# Patient Record
Sex: Female | Born: 1990 | Race: White | Hispanic: No | Smoking: Current every day smoker
Health system: Southern US, Community
[De-identification: ages and names within clinical notes are randomized; demographics above are authoritative.]

## PROBLEM LIST (undated history)

## (undated) DIAGNOSIS — K2100 Gastro-esophageal reflux disease with esophagitis, without bleeding: Secondary | ICD-10-CM

## (undated) DIAGNOSIS — J45909 Unspecified asthma, uncomplicated: Secondary | ICD-10-CM

---

## 2017-02-09 ENCOUNTER — Encounter (HOSPITAL_BASED_OUTPATIENT_CLINIC_OR_DEPARTMENT_OTHER): Payer: Self-pay | Admitting: *Deleted

## 2017-02-09 ENCOUNTER — Emergency Department (HOSPITAL_BASED_OUTPATIENT_CLINIC_OR_DEPARTMENT_OTHER)
Admission: EM | Admit: 2017-02-09 | Discharge: 2017-02-09 | Disposition: A | Discharge status: Home / Self Care | Payer: Self-pay | Attending: Emergency Medicine | Admitting: Emergency Medicine

## 2017-02-09 ENCOUNTER — Emergency Department (HOSPITAL_BASED_OUTPATIENT_CLINIC_OR_DEPARTMENT_OTHER): Payer: Self-pay

## 2017-02-09 DIAGNOSIS — J45901 Unspecified asthma with (acute) exacerbation: Secondary | ICD-10-CM

## 2017-02-09 DIAGNOSIS — Z79899 Other long term (current) drug therapy: Secondary | ICD-10-CM | POA: Insufficient documentation

## 2017-02-09 DIAGNOSIS — R0602 Shortness of breath: Secondary | ICD-10-CM | POA: Insufficient documentation

## 2017-02-09 DIAGNOSIS — R05 Cough: Secondary | ICD-10-CM | POA: Insufficient documentation

## 2017-02-09 DIAGNOSIS — J45909 Unspecified asthma, uncomplicated: Secondary | ICD-10-CM | POA: Insufficient documentation

## 2017-02-09 HISTORY — DX: Gastro-esophageal reflux disease with esophagitis: K21.0

## 2017-02-09 HISTORY — DX: Unspecified asthma, uncomplicated: J45.909

## 2017-02-09 HISTORY — DX: Gastro-esophageal reflux disease with esophagitis, without bleeding: K21.00

## 2017-02-09 MED ORDER — PREDNISONE 10 MG PO TABS
20.0000 mg | ORAL_TABLET | Freq: Two times a day (BID) | ORAL | 0 refills | Status: DC
Start: 2017-02-09 — End: 2023-04-17

## 2017-02-09 MED ORDER — IPRATROPIUM-ALBUTEROL 0.5-2.5 (3) MG/3ML IN SOLN
RESPIRATORY_TRACT | Status: AC
  Administered 2017-02-09: 3 mL
  Filled 2017-02-09: qty 3

## 2017-02-09 NOTE — ED Provider Notes (Signed)
[facility] Provider Note   CSN: [id number] Arrival date & time: 02/09/17  1019     History   Chief Complaint Chief Complaint  Patient presents with  . Cough    HPI Lindsay Wall is a 26 y.o. female.  Patient is a 26 year old female with history of asthma. She presents with shortness of breath and wheezing over the past 3 days. She does report some cough which is minimally productive. She denies any chest pain or fevers. She has tried her prescribed inhalers with minimal relief.   The history is provided by the patient.  Shortness of Breath  This is a recurrent problem. The average episode lasts 3 days. The problem occurs continuously.The problem has been gradually worsening. Associated symptoms include cough. Pertinent negatives include no fever, no sputum production and no chest pain. She has tried beta-agonist inhalers for the symptoms. The treatment provided no relief. Associated medical issues include asthma.    Past Medical History:  Diagnosis Date  . Asthma   . Reflux esophagitis     There are no active problems to display for this patient.   Past Surgical History:  Procedure Laterality Date  . CESAREAN SECTION      OB History    No data available       Home Medications    Prior to Admission medications   Medication Sig Start Date End Date Taking? Authorizing Provider  albuterol (PROVENTIL) (2.5 MG/3ML) 0.083% nebulizer solution Take 2.5 mg by nebulization every 6 (six) hours as needed for wheezing or shortness of breath.   Yes [provider]  fluticasone-salmeterol (ADVAIR HFA) 115-21 MCG/ACT inhaler Inhale 2 puffs into the lungs 2 (two) times daily.   Yes [provider]  levonorgestrel (MIRENA) 20 MCG/24HR IUD 1 each by Intrauterine route once.   Yes [provider]  omeprazole (PRILOSEC) 10 MG capsule Take 10 mg by mouth daily.   Yes [provider]    Family History No family history on  file.  Social History Social History  Substance Use Topics  . Smoking status: Never Smoker  . Smokeless tobacco: Never Used  . Alcohol use No     Allergies   Zithromax [azithromycin]   Review of Systems Review of Systems  Constitutional: Negative for fever.  Respiratory: Positive for cough and shortness of breath. Negative for sputum production.   Cardiovascular: Negative for chest pain.  All other systems reviewed and are negative.    Physical Exam Updated Vital Signs BP 130/84   Pulse 68   Temp 99.3 F (37.4 C) (Oral)   Resp 14   Ht 5\' 5"  (1.651 m)   Wt 107 kg (236 lb)   SpO2 99%   BMI 39.27 kg/m   Physical Exam  Constitutional: She is oriented to person, place, and time. She appears well-developed and well-nourished. No distress.  HENT:  Head: Normocephalic and atraumatic.  Mouth/Throat: Oropharynx is clear and moist.  Neck: Normal range of motion. Neck supple.  Cardiovascular: Normal rate and regular rhythm.  Exam reveals no gallop and no friction rub.   No murmur heard. Pulmonary/Chest: Effort normal and breath sounds normal. No respiratory distress. She has no wheezes.  Abdominal: Soft. Bowel sounds are normal. She exhibits no distension. There is no tenderness.  Musculoskeletal: Normal range of motion.  Neurological: She is alert and oriented to person, place, and time.  Skin: Skin is warm and dry. She is not diaphoretic.  Nursing note and vitals reviewed.  ED Treatments / Results  Labs (all labs ordered are listed, but only abnormal results are displayed) Labs Reviewed - No data to display  EKG  EKG Interpretation None       Radiology Dg Chest 2 View  Result Date: 02/09/2017 CLINICAL DATA:  Cough and shortness of breath EXAM: CHEST  2 VIEW COMPARISON:  None. FINDINGS: The heart size and mediastinal contours are within normal limits. Both lungs are clear. The visualized skeletal structures are unremarkable. IMPRESSION: Negative chest.  Electronically Signed   By: Marnee SpringJonathon  Watts M.D.   On: 02/09/2017 12:26    Procedures Procedures (including critical care time)  Medications Ordered in ED Medications  ipratropium-albuterol (DUONEB) 0.5-2.5 (3) MG/3ML nebulizer solution (3 mLs  Given 02/09/17 1035)     Initial Impression / Assessment and Plan / ED Course  I have reviewed the triage vital signs and the nursing notes.  Pertinent labs & imaging results that were available during my care of the patient were reviewed by me and considered in my medical decision making (see chart for details).  Patient given an albuterol treatment and is feeling better. Her chest x-ray is clear. I suspect a viral URI with exacerbation of RAD. She will be discharged with a prednisone course, continued use of her inhaler, and follow-up as needed.  Final Clinical Impressions(s) / ED Diagnoses   Final diagnoses:  None    New Prescriptions New Prescriptions   No medications on file     Geoffery Lyonselo, Lyndel Sarate, MD 02/09/17 1235

## 2017-02-09 NOTE — Discharge Instructions (Signed)
Prednisone as prescribed.  Continue your inhalers as previously prescribed.  Return to the emergency department of symptoms significant only worsen or change.

## 2017-02-09 NOTE — ED Triage Notes (Signed)
Patient states she has a three day history of her asthma acting up.  States she has a productive cough with green brown secretions with occasional blood.

## 2019-04-08 IMAGING — CR DG CHEST 2V
2 series · 2 of 2 positions shown · non-contrast
Comparison: None.

CLINICAL DATA: Cough and shortness of breath

EXAM:
CHEST  2 VIEW

[w chest pa]
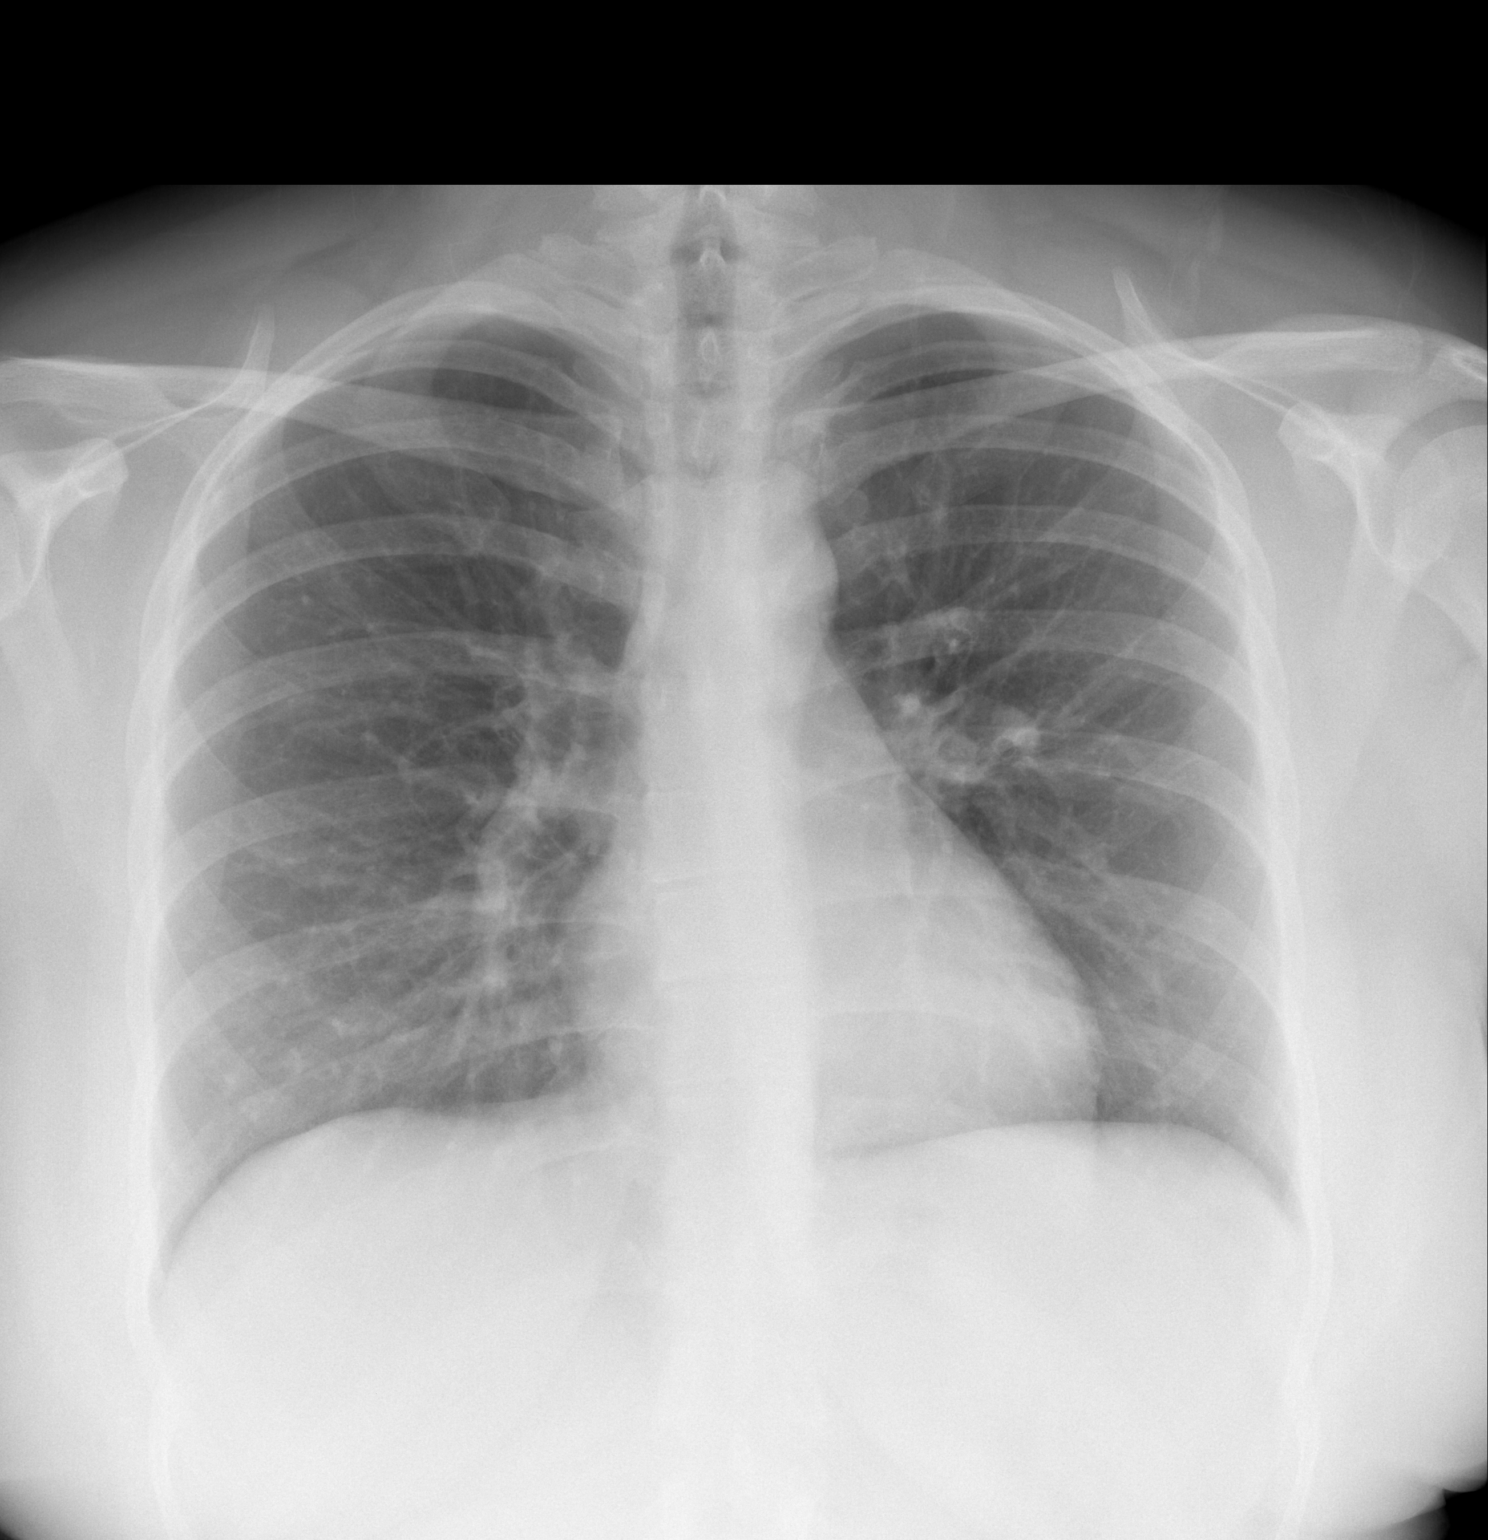

[w chest lat]
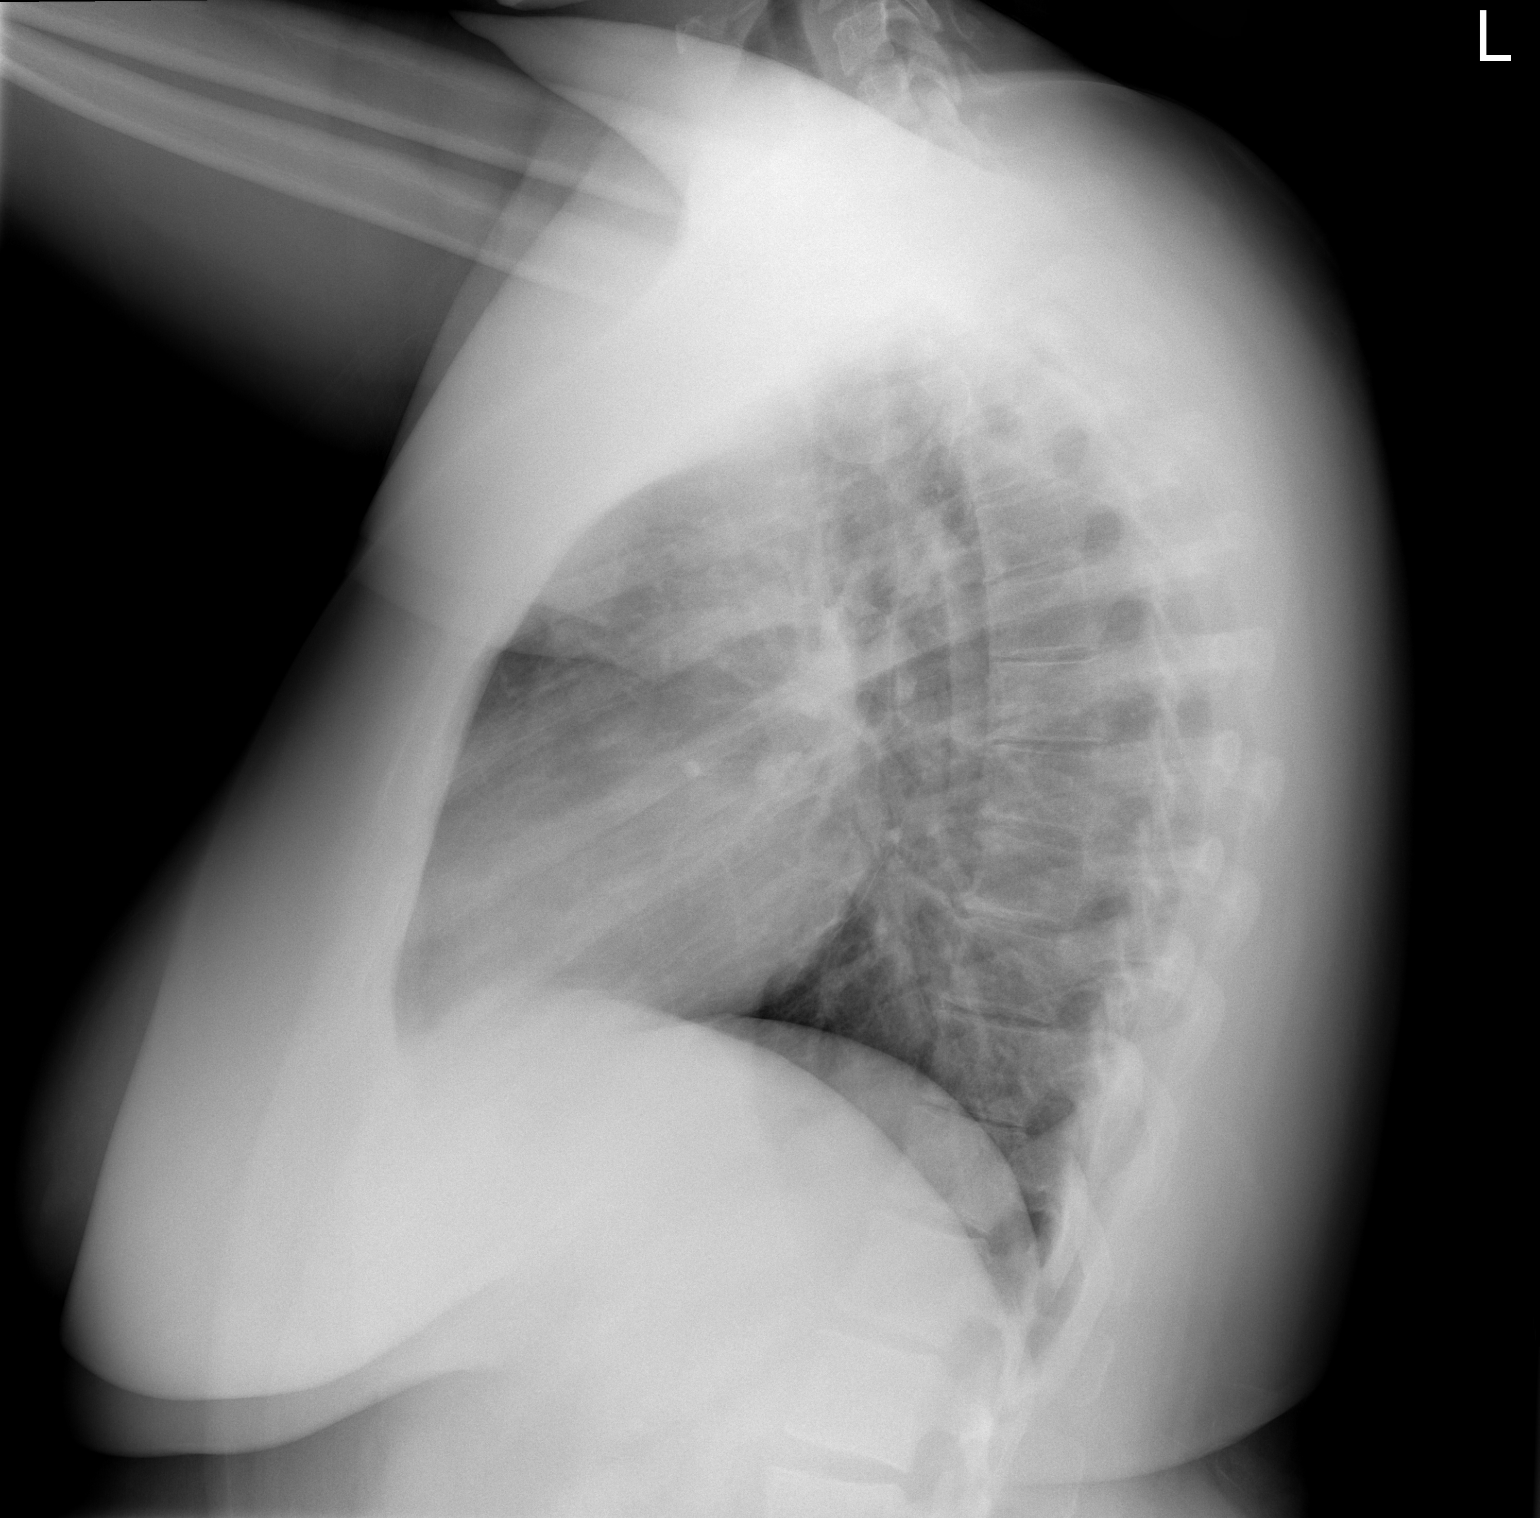

[2 of 2 positions shown; findings below may reference images not displayed]

FINDINGS: The heart size and mediastinal contours are within normal limits.
Both lungs are clear. The visualized skeletal structures are
unremarkable.
IMPRESSION: Negative chest.

## 2021-08-13 DIAGNOSIS — K219 Gastro-esophageal reflux disease without esophagitis: Secondary | ICD-10-CM | POA: Diagnosis not present

## 2021-08-13 DIAGNOSIS — F4323 Adjustment disorder with mixed anxiety and depressed mood: Secondary | ICD-10-CM | POA: Diagnosis not present

## 2021-08-13 DIAGNOSIS — K589 Irritable bowel syndrome without diarrhea: Secondary | ICD-10-CM | POA: Diagnosis not present

## 2021-08-13 DIAGNOSIS — F321 Major depressive disorder, single episode, moderate: Secondary | ICD-10-CM | POA: Diagnosis not present

## 2021-08-13 DIAGNOSIS — R110 Nausea: Secondary | ICD-10-CM | POA: Diagnosis not present

## 2021-10-10 DIAGNOSIS — K219 Gastro-esophageal reflux disease without esophagitis: Secondary | ICD-10-CM | POA: Diagnosis not present

## 2021-10-10 DIAGNOSIS — F321 Major depressive disorder, single episode, moderate: Secondary | ICD-10-CM | POA: Diagnosis not present

## 2021-11-26 DIAGNOSIS — Z79899 Other long term (current) drug therapy: Secondary | ICD-10-CM | POA: Diagnosis not present

## 2021-11-26 DIAGNOSIS — F321 Major depressive disorder, single episode, moderate: Secondary | ICD-10-CM | POA: Diagnosis not present

## 2021-11-26 DIAGNOSIS — K219 Gastro-esophageal reflux disease without esophagitis: Secondary | ICD-10-CM | POA: Diagnosis not present

## 2021-11-26 DIAGNOSIS — K589 Irritable bowel syndrome without diarrhea: Secondary | ICD-10-CM | POA: Diagnosis not present

## 2021-11-26 DIAGNOSIS — R110 Nausea: Secondary | ICD-10-CM | POA: Diagnosis not present

## 2021-12-31 DIAGNOSIS — R1010 Upper abdominal pain, unspecified: Secondary | ICD-10-CM | POA: Diagnosis not present

## 2021-12-31 DIAGNOSIS — R112 Nausea with vomiting, unspecified: Secondary | ICD-10-CM | POA: Diagnosis not present

## 2022-01-04 DIAGNOSIS — R1010 Upper abdominal pain, unspecified: Secondary | ICD-10-CM | POA: Diagnosis not present

## 2022-01-23 DIAGNOSIS — R109 Unspecified abdominal pain: Secondary | ICD-10-CM | POA: Diagnosis not present

## 2022-01-23 DIAGNOSIS — R1110 Vomiting, unspecified: Secondary | ICD-10-CM | POA: Diagnosis not present

## 2022-01-23 DIAGNOSIS — G8929 Other chronic pain: Secondary | ICD-10-CM | POA: Diagnosis not present

## 2022-01-23 DIAGNOSIS — K769 Liver disease, unspecified: Secondary | ICD-10-CM | POA: Diagnosis not present

## 2022-08-23 DIAGNOSIS — F330 Major depressive disorder, recurrent, mild: Secondary | ICD-10-CM | POA: Diagnosis not present

## 2022-08-23 DIAGNOSIS — F419 Anxiety disorder, unspecified: Secondary | ICD-10-CM | POA: Diagnosis not present

## 2023-04-17 ENCOUNTER — Ambulatory Visit: Payer: 59 | Admitting: Family Medicine

## 2023-04-17 ENCOUNTER — Encounter: Payer: Self-pay | Admitting: Family Medicine

## 2023-04-17 VITALS — BP 117/78 | HR 83 | Ht 66.0 in | Wt 241.8 lb

## 2023-04-17 DIAGNOSIS — R233 Spontaneous ecchymoses: Secondary | ICD-10-CM | POA: Diagnosis not present

## 2023-04-17 DIAGNOSIS — F321 Major depressive disorder, single episode, moderate: Secondary | ICD-10-CM | POA: Insufficient documentation

## 2023-04-17 DIAGNOSIS — F411 Generalized anxiety disorder: Secondary | ICD-10-CM | POA: Insufficient documentation

## 2023-04-17 DIAGNOSIS — R1111 Vomiting without nausea: Secondary | ICD-10-CM | POA: Insufficient documentation

## 2023-04-17 DIAGNOSIS — J4540 Moderate persistent asthma, uncomplicated: Secondary | ICD-10-CM | POA: Insufficient documentation

## 2023-04-17 MED ORDER — BUPROPION HCL ER (XL) 300 MG PO TB24
300.0000 mg | ORAL_TABLET | Freq: Every day | ORAL | 1 refills | Status: AC
Start: 2023-04-17 — End: ?

## 2023-04-17 MED ORDER — ALBUTEROL SULFATE HFA 108 (90 BASE) MCG/ACT IN AERS: 2.0000 | INHALATION_SPRAY | Freq: Four times a day (QID) | RESPIRATORY_TRACT | 0 refills | Status: AC | PRN

## 2023-04-17 MED ORDER — ALBUTEROL SULFATE (2.5 MG/3ML) 0.083% IN NEBU: 2.5000 mg | INHALATION_SOLUTION | Freq: Four times a day (QID) | RESPIRATORY_TRACT | 3 refills | Status: AC | PRN

## 2023-04-17 MED ORDER — TRELEGY ELLIPTA 100-62.5-25 MCG/ACT IN AEPB
1.0000 | INHALATION_SPRAY | Freq: Every day | RESPIRATORY_TRACT | 5 refills | Status: AC
Start: 2023-04-17 — End: ?

## 2023-04-17 MED ORDER — FLUOXETINE HCL 20 MG PO TABS: 20.0000 mg | ORAL_TABLET | Freq: Every day | ORAL | 1 refills | Status: AC

## 2023-04-17 NOTE — Assessment & Plan Note (Signed)
-   referral sent to GI pt said that she has seen GI in the past and no one has been able to figure this out. She was taking omeprazole when this happened so it is less likely this is reflux related. Did discuss if she uses marijuana and then tries to stop this can cause cyclic vomiting syndrome and pt seemed to think this could possible be it.

## 2023-04-17 NOTE — Assessment & Plan Note (Signed)
Refilled prozac 20mg  recommend pt follow up in 3 months for efficacy

## 2023-04-17 NOTE — Progress Notes (Signed)
New patient visit   Patient: Lindsay Wall   DOB: 1990/12/26   32 y.o. Female  MRN: [id number] Visit Date: 04/17/2023  Today's healthcare provider: Charlton Amor, DO   Chief Complaint  Patient presents with   New Patient (Initial Visit)    Establish care    SUBJECTIVE    Chief Complaint  Patient presents with   New Patient (Initial Visit)    Establish care   HPI HPI     New Patient (Initial Visit)    Additional comments: Establish care      Last edited by Roselyn Reef, CMA on 04/17/2023  3:48 PM.       Pt presents to establish care.   Has concerns of daily vomiting upon arising.   MDD/GAD - on prozac and buproprion has been working well for her  - interested in therapy   Asthma  - on albuterol needs inhaler daily   Has mirena, needs replacement   Has random bruising   Review of Systems  Constitutional:  Negative for activity change, fatigue and fever.  Respiratory:  Negative for cough and shortness of breath.   Cardiovascular:  Negative for chest pain.  Gastrointestinal:  Positive for vomiting. Negative for abdominal pain.  Genitourinary:  Negative for difficulty urinating.       Current Meds  Medication Sig   albuterol (VENTOLIN HFA) 108 (90 Base) MCG/ACT inhaler Inhale 2 puffs into the lungs every 6 (six) hours as needed for wheezing or shortness of breath.   buPROPion (WELLBUTRIN XL) 300 MG 24 hr tablet Take 1 tablet (300 mg total) by mouth daily.   FLUoxetine (PROZAC) 20 MG tablet Take 1 tablet (20 mg total) by mouth daily.   Fluticasone-Umeclidin-Vilant (TRELEGY ELLIPTA) 100-62.5-25 MCG/ACT AEPB Inhale 1 puff into the lungs daily.   levonorgestrel (MIRENA) 20 MCG/24HR IUD 1 each by Intrauterine route once.   omeprazole (PRILOSEC) 10 MG capsule Take 10 mg by mouth daily.   [DISCONTINUED] albuterol (PROVENTIL) (2.5 MG/3ML) 0.083% nebulizer solution Take 2.5 mg by nebulization every 6 (six) hours as needed for wheezing or shortness of  breath.   [DISCONTINUED] fluticasone-salmeterol (ADVAIR HFA) 115-21 MCG/ACT inhaler Inhale 2 puffs into the lungs 2 (two) times daily.   [DISCONTINUED] predniSONE (DELTASONE) 10 MG tablet Take 2 tablets (20 mg total) by mouth 2 (two) times daily.    OBJECTIVE    BP 117/78 (BP Location: Left Arm, Patient Position: Sitting, Cuff Size: Large)   Pulse 83   Ht 5\' 6"  (1.676 m)   Wt 241 lb 12 oz (109.7 kg)   SpO2 99%   BMI 39.02 kg/m   Physical Exam Vitals and nursing note reviewed.  Constitutional:      General: She is not in acute distress.    Appearance: Normal appearance.  HENT:     Head: Normocephalic and atraumatic.     Right Ear: External ear normal.     Left Ear: External ear normal.     Nose: Nose normal.  Eyes:     Conjunctiva/sclera: Conjunctivae normal.  Cardiovascular:     Rate and Rhythm: Normal rate and regular rhythm.  Pulmonary:     Effort: Pulmonary effort is normal.     Breath sounds: Normal breath sounds.  Neurological:     General: No focal deficit present.     Mental Status: She is alert and oriented to person, place, and time.  Psychiatric:        Mood and Affect: Mood normal.  Behavior: Behavior normal.        Thought Content: Thought content normal.        Judgment: Judgment normal.        ASSESSMENT & PLAN    Problem List Items Addressed This Visit       Respiratory   Moderate persistent asthma    Pt poorly controlled as she is having to use her albuterol inhaler daily  - increased therapy to trellegy  - refilled nebulizer treatment and inhaler      Relevant Medications   albuterol (PROVENTIL) (2.5 MG/3ML) 0.083% nebulizer solution   albuterol (VENTOLIN HFA) 108 (90 Base) MCG/ACT inhaler   Fluticasone-Umeclidin-Vilant (TRELEGY ELLIPTA) 100-62.5-25 MCG/ACT AEPB     Digestive   Vomiting without nausea    - referral sent to GI pt said that she has seen GI in the past and no one has been able to figure this out. She was taking  omeprazole when this happened so it is less likely this is reflux related. Did discuss if she uses marijuana and then tries to stop this can cause cyclic vomiting syndrome and pt seemed to think this could possible be it.       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 episode (HCC) - Primary    Refill bupropion       Relevant Medications   FLUoxetine (PROZAC) 20 MG tablet   buPROPion (WELLBUTRIN XL) 300 MG 24 hr tablet   Other Relevant Orders   Ambulatory referral to Behavioral Health   GAD (generalized anxiety disorder)    Refilled prozac 20mg  recommend pt follow up in 3 months for efficacy      Relevant Medications   FLUoxetine (PROZAC) 20 MG tablet   buPROPion (WELLBUTRIN XL) 300 MG 24 hr tablet   Other Relevant Orders   Ambulatory referral to Behavioral Health   Easy bruising    Pt notes easy brusing will go ahead and check platelet count and anemia       Relevant Orders   CBC w/Diff/Platelet   Fe+TIBC+Fer    Return in about 3 months (around 07/18/2023) for MDD and GAD follow up.      Meds ordered this encounter  Medications   albuterol (PROVENTIL) (2.5 MG/3ML) 0.083% nebulizer solution    Sig: Take 3 mLs (2.5 mg total) by nebulization every 6 (six) hours as needed for wheezing or shortness of breath.    Dispense:  75 mL    Refill:  3   albuterol (VENTOLIN HFA) 108 (90 Base) MCG/ACT inhaler    Sig: Inhale 2 puffs into the lungs every 6 (six) hours as needed for wheezing or shortness of breath.    Dispense:  8 g    Refill:  0   FLUoxetine (PROZAC) 20 MG tablet    Sig: Take 1 tablet (20 mg total) by mouth daily.    Dispense:  90 tablet    Refill:  1   buPROPion (WELLBUTRIN XL) 300 MG 24 hr tablet    Sig: Take 1 tablet (300 mg total) by mouth daily.    Dispense:  90 tablet    Refill:  1   Fluticasone-Umeclidin-Vilant (TRELEGY ELLIPTA) 100-62.5-25 MCG/ACT AEPB     Sig: Inhale 1 puff into the lungs daily.    Dispense:  1 each    Refill:  5    Orders Placed This Encounter  Procedures  CBC w/Diff/Platelet   Fe+TIBC+Fer   Ambulatory referral to Gastroenterology    Referral Priority:   Routine    Referral Type:   Consultation    Referral Reason:   Specialty Services Required    Number of Visits Requested:   1   Ambulatory referral to Behavioral Health    Referral Priority:   Routine    Referral Type:   Psychiatric    Referral Reason:   Specialty Services Required    Requested Specialty:   Behavioral Health    Number of Visits Requested:   1     Charlton Amor, DO  [facility] [phone] (phone) [phone] (fax)  [facility]

## 2023-04-17 NOTE — Assessment & Plan Note (Signed)
Pt poorly controlled as she is having to use her albuterol inhaler daily  - increased therapy to trellegy  - refilled nebulizer treatment and inhaler

## 2023-04-17 NOTE — Assessment & Plan Note (Signed)
Refill- bupropion °

## 2023-04-17 NOTE — Assessment & Plan Note (Signed)
Pt notes easy brusing will go ahead and check platelet count and anemia

## 2023-04-24 ENCOUNTER — Encounter: Payer: Self-pay | Admitting: Family Medicine

## 2023-04-24 MED ORDER — ALBUTEROL SULFATE HFA 108 (90 BASE) MCG/ACT IN AERS: 2.0000 | INHALATION_SPRAY | Freq: Four times a day (QID) | RESPIRATORY_TRACT | 0 refills | Status: AC | PRN

## 2023-07-08 DIAGNOSIS — R091 Pleurisy: Secondary | ICD-10-CM | POA: Diagnosis not present

## 2023-07-08 DIAGNOSIS — Z20822 Contact with and (suspected) exposure to covid-19: Secondary | ICD-10-CM | POA: Diagnosis not present

## 2023-07-08 DIAGNOSIS — R059 Cough, unspecified: Secondary | ICD-10-CM | POA: Diagnosis not present

## 2023-07-08 DIAGNOSIS — Z881 Allergy status to other antibiotic agents status: Secondary | ICD-10-CM | POA: Diagnosis not present

## 2023-07-08 DIAGNOSIS — R9431 Abnormal electrocardiogram [ECG] [EKG]: Secondary | ICD-10-CM | POA: Diagnosis not present

## 2023-07-08 DIAGNOSIS — M546 Pain in thoracic spine: Secondary | ICD-10-CM | POA: Diagnosis not present

## 2023-07-08 DIAGNOSIS — R0981 Nasal congestion: Secondary | ICD-10-CM | POA: Diagnosis not present

## 2023-11-13 ENCOUNTER — Encounter: Payer: Self-pay | Admitting: Family Medicine

## 2024-05-11 DIAGNOSIS — Z30431 Encounter for routine checking of intrauterine contraceptive device: Secondary | ICD-10-CM | POA: Diagnosis not present

## 2024-05-11 DIAGNOSIS — R87610 Atypical squamous cells of undetermined significance on cytologic smear of cervix (ASC-US): Secondary | ICD-10-CM | POA: Diagnosis not present

## 2024-05-11 DIAGNOSIS — Z3009 Encounter for other general counseling and advice on contraception: Secondary | ICD-10-CM | POA: Diagnosis not present

## 2024-05-11 DIAGNOSIS — Z1159 Encounter for screening for other viral diseases: Secondary | ICD-10-CM | POA: Diagnosis not present

## 2024-05-11 DIAGNOSIS — Z114 Encounter for screening for human immunodeficiency virus [HIV]: Secondary | ICD-10-CM | POA: Diagnosis not present

## 2024-05-11 DIAGNOSIS — Z113 Encounter for screening for infections with a predominantly sexual mode of transmission: Secondary | ICD-10-CM | POA: Diagnosis not present

## 2024-05-11 DIAGNOSIS — Z124 Encounter for screening for malignant neoplasm of cervix: Secondary | ICD-10-CM | POA: Diagnosis not present

## 2024-05-11 DIAGNOSIS — Z01419 Encounter for gynecological examination (general) (routine) without abnormal findings: Secondary | ICD-10-CM | POA: Diagnosis not present

## 2024-05-12 DIAGNOSIS — J45909 Unspecified asthma, uncomplicated: Secondary | ICD-10-CM | POA: Diagnosis not present

## 2024-05-12 DIAGNOSIS — M25552 Pain in left hip: Secondary | ICD-10-CM | POA: Diagnosis not present

## 2024-05-12 DIAGNOSIS — M5442 Lumbago with sciatica, left side: Secondary | ICD-10-CM | POA: Diagnosis not present

## 2024-05-21 DIAGNOSIS — M7062 Trochanteric bursitis, left hip: Secondary | ICD-10-CM | POA: Diagnosis not present

## 2024-05-21 DIAGNOSIS — M4306 Spondylolysis, lumbar region: Secondary | ICD-10-CM | POA: Diagnosis not present

## 2024-05-21 DIAGNOSIS — M545 Low back pain, unspecified: Secondary | ICD-10-CM | POA: Diagnosis not present

## 2024-09-23 ENCOUNTER — Encounter: Admitting: Urgent Care
